# Patient Record
Sex: Female | Born: 1965 | Hispanic: No | Smoking: Never smoker
Health system: Southern US, Community
[De-identification: ages and names within clinical notes are randomized; demographics above are authoritative.]

## PROBLEM LIST (undated history)

## (undated) DIAGNOSIS — M26609 Unspecified temporomandibular joint disorder, unspecified side: Secondary | ICD-10-CM

## (undated) DIAGNOSIS — J45909 Unspecified asthma, uncomplicated: Secondary | ICD-10-CM

## (undated) DIAGNOSIS — F410 Panic disorder [episodic paroxysmal anxiety] without agoraphobia: Secondary | ICD-10-CM

## (undated) HISTORY — PX: GASTRIC BYPASS: SHX52

---

## 2014-09-10 ENCOUNTER — Encounter (HOSPITAL_COMMUNITY): Payer: Self-pay

## 2014-09-10 ENCOUNTER — Emergency Department (HOSPITAL_COMMUNITY)
Admission: EM | Admit: 2014-09-10 | Discharge: 2014-09-10 | Disposition: A | Discharge status: Home / Self Care | Payer: Self-pay | Source: Home / Self Care | Attending: Emergency Medicine | Admitting: Emergency Medicine

## 2014-09-10 DIAGNOSIS — M2669 Other specified disorders of temporomandibular joint: Secondary | ICD-10-CM

## 2014-09-10 DIAGNOSIS — F508 Other eating disorders: Secondary | ICD-10-CM

## 2014-09-10 DIAGNOSIS — M26629 Arthralgia of temporomandibular joint, unspecified side: Secondary | ICD-10-CM

## 2014-09-10 DIAGNOSIS — N390 Urinary tract infection, site not specified: Secondary | ICD-10-CM

## 2014-09-10 DIAGNOSIS — F5089 Other specified eating disorder: Secondary | ICD-10-CM

## 2014-09-10 HISTORY — DX: Unspecified asthma, uncomplicated: J45.909

## 2014-09-10 HISTORY — DX: Unspecified temporomandibular joint disorder, unspecified side: M26.609

## 2014-09-10 LAB — POCT URINALYSIS DIP (DEVICE)
BILIRUBIN URINE: NEGATIVE
Glucose, UA: NEGATIVE mg/dL
HGB URINE DIPSTICK: NEGATIVE
Ketones, ur: NEGATIVE mg/dL
NITRITE: NEGATIVE
Protein, ur: NEGATIVE mg/dL
UROBILINOGEN UA: 0.2 mg/dL (ref 0.0–1.0)
pH: 5.5 (ref 5.0–8.0)

## 2014-09-10 LAB — POCT PREGNANCY, URINE: Preg Test, Ur: NEGATIVE

## 2014-09-10 MED ORDER — KEFLEX 500 MG PO CAPS: 500.0000 mg | ORAL_CAPSULE | Freq: Four times a day (QID) | ORAL | Status: AC

## 2014-09-10 MED ORDER — FERROUS GLUCONATE 324 (38 FE) MG PO TABS: 324.0000 mg | ORAL_TABLET | Freq: Every day | ORAL | Status: AC

## 2014-09-10 MED ORDER — IBUPROFEN 800 MG PO TABS: 800.0000 mg | ORAL_TABLET | Freq: Three times a day (TID) | ORAL | Status: AC | PRN

## 2014-09-10 NOTE — ED Provider Notes (Signed)
CSN: [id number]     Arrival date & time 09/10/14  1144 History   First MD Initiated Contact with Patient 09/10/14 1155     Chief Complaint  Patient presents with  . Jaw Pain   (Consider location/radiation/quality/duration/timing/severity/associated sxs/prior Treatment) HPI She is a 49 year old woman here for several concerns.  She has TMJ syndrome and needs a refill of her ibuprofen 800 mg tablets.  She also has a diagnosis of pica. She will consume up to 3 boxes of cornstarch a day. This is associated with some chronic anemia per her report. She is on B-12 injections currently. She is not taking any iron right now.  She also reports some lower abdominal cramping. Her last period was in October.  This is associated with some mild nausea. This is been going on for about 2 weeks. No flank pain.  Past Medical History  Diagnosis Date  . Asthma   . TMJ (temporomandibular joint syndrome)    Past Surgical History  Procedure Laterality Date  . Gastric bypass     History reviewed. No pertinent family history. History  Substance Use Topics  . Smoking status: Never Smoker   . Smokeless tobacco: Not on file  . Alcohol Use: No   OB History    No data available     Review of Systems  Constitutional: Negative.   HENT:       Jaw pain, chronic  Respiratory: Negative.   Cardiovascular: Negative.   Gastrointestinal: Positive for nausea and abdominal pain (suprapubic). Negative for vomiting.  Genitourinary: Positive for dysuria.    Allergies  Morphine and related  Home Medications   Prior to Admission medications   Medication Sig Start Date End Date Taking? Authorizing Provider  ferrous gluconate (FERGON) 324 MG tablet Take 1 tablet (324 mg total) by mouth daily with breakfast. 09/10/14   Charm Rings, MD  ibuprofen (ADVIL,MOTRIN) 800 MG tablet Take 1 tablet (800 mg total) by mouth every 8 (eight) hours as needed. 09/10/14   Charm Rings, MD  KEFLEX 500 MG capsule Take 1 capsule (500  mg total) by mouth 4 (four) times daily. 09/10/14   Charm Rings, MD   BP 159/96 mmHg  Pulse 77  Temp(Src) 98.1 F (36.7 C) (Oral)  Resp 16  SpO2 100%  LMP 06/28/2014 (Exact Date) Physical Exam  Constitutional: She is oriented to person, place, and time. She appears well-developed and well-nourished. No distress.  HENT:  Head: Normocephalic and atraumatic.  Mouth/Throat: Abnormal dentition.  Neck: Neck supple.  Cardiovascular: Normal rate.   Pulmonary/Chest: Effort normal.  Abdominal: Soft. Bowel sounds are normal. She exhibits no distension. There is tenderness (mild suprapubic). There is no rebound and no guarding.  Neurological: She is alert and oriented to person, place, and time.    ED Course  Procedures (including critical care time) Labs Review Labs Reviewed  POCT URINALYSIS DIP (DEVICE) - Abnormal; Notable for the following:    Leukocytes, UA TRACE (*)    All other components within normal limits  URINE CULTURE  POCT PREGNANCY, URINE    Imaging Review No results found.   MDM   1. UTI (lower urinary tract infection)   2. Pica   3. TMJ syndrome    Urine sent for culture. Keflex 4 times a day for 3 days. I refilled her ibuprofen for her TMJ syndrome. I also prescribed ferrous gluconate to see if that will help with her pica. [facility] provided.    Charm Rings, MD 09/10/14 [phone]

## 2014-09-10 NOTE — Discharge Instructions (Signed)
You may have a bladder infection. Take keflex 4 times a day for 3 days.  The pica is likely at least in part due to your anemia.   Try taking Ferrous Gluconate- this tends to be better tolerated than other iron preparations.  I have refilled the ibuprofen 800 for your TMJ.  Follow up as needed.

## 2014-09-10 NOTE — ED Notes (Signed)
C/o pain in her jaw (" TMJ") and lower abdominal pain , no menses since 10-20

## 2014-09-12 LAB — URINE CULTURE

## 2014-09-12 NOTE — ED Notes (Signed)
Urine culture: >100,000 colonies Klebsiella pneumoniae.  Pt. adequately treated with Keflex. Lindsey Campbell 09/12/2014

## 2015-07-11 ENCOUNTER — Emergency Department (HOSPITAL_COMMUNITY)
Admission: EM | Admit: 2015-07-11 | Discharge: 2015-07-11 | Disposition: A | Discharge status: Home / Self Care | Payer: Self-pay | Attending: Emergency Medicine | Admitting: Emergency Medicine

## 2015-07-11 ENCOUNTER — Emergency Department (HOSPITAL_COMMUNITY): Payer: Self-pay

## 2015-07-11 ENCOUNTER — Encounter (HOSPITAL_COMMUNITY): Payer: Self-pay | Admitting: Cardiology

## 2015-07-11 DIAGNOSIS — Z8739 Personal history of other diseases of the musculoskeletal system and connective tissue: Secondary | ICD-10-CM | POA: Insufficient documentation

## 2015-07-11 DIAGNOSIS — R5383 Other fatigue: Secondary | ICD-10-CM | POA: Insufficient documentation

## 2015-07-11 DIAGNOSIS — Z79899 Other long term (current) drug therapy: Secondary | ICD-10-CM | POA: Insufficient documentation

## 2015-07-11 DIAGNOSIS — J45901 Unspecified asthma with (acute) exacerbation: Secondary | ICD-10-CM | POA: Insufficient documentation

## 2015-07-11 DIAGNOSIS — Z8659 Personal history of other mental and behavioral disorders: Secondary | ICD-10-CM | POA: Insufficient documentation

## 2015-07-11 DIAGNOSIS — Z792 Long term (current) use of antibiotics: Secondary | ICD-10-CM | POA: Insufficient documentation

## 2015-07-11 HISTORY — DX: Panic disorder (episodic paroxysmal anxiety): F41.0

## 2015-07-11 MED ORDER — ALBUTEROL SULFATE (2.5 MG/3ML) 0.083% IN NEBU: 2.5000 mg | INHALATION_SOLUTION | Freq: Four times a day (QID) | RESPIRATORY_TRACT | Status: AC | PRN

## 2015-07-11 MED ORDER — ALBUTEROL SULFATE HFA 108 (90 BASE) MCG/ACT IN AERS
2.0000 | INHALATION_SPRAY | RESPIRATORY_TRACT | Status: DC | PRN
  Administered 2015-07-11: 2 via RESPIRATORY_TRACT
  Filled 2015-07-11: qty 6.7

## 2015-07-11 MED ORDER — PREDNISONE 20 MG PO TABS
60.0000 mg | ORAL_TABLET | Freq: Once | ORAL | Status: AC
  Administered 2015-07-11: 60 mg via ORAL
  Filled 2015-07-11: qty 3

## 2015-07-11 MED ORDER — PREDNISONE 20 MG PO TABS
40.0000 mg | ORAL_TABLET | Freq: Every day | ORAL | Status: DC
Start: 2015-07-11 — End: 2015-08-02

## 2015-07-11 MED ORDER — IPRATROPIUM-ALBUTEROL 0.5-2.5 (3) MG/3ML IN SOLN
3.0000 mL | Freq: Once | RESPIRATORY_TRACT | Status: AC
  Administered 2015-07-11: 3 mL via RESPIRATORY_TRACT
  Filled 2015-07-11: qty 3

## 2015-07-11 NOTE — ED Notes (Signed)
Reports for the past month she has had a cough, congestion but no fever. States she has tried OTC medication and tramadol but no helping.

## 2015-07-11 NOTE — ED Notes (Addendum)
Pt c/o cough, wheezing, anterior chest pain and weakness x 2-3 weeks. States she has been using her nebulizer at home without improvement. States "I feel like I'm choking when I lay down at night". DENIES nasal congestion.

## 2015-07-11 NOTE — Discharge Instructions (Signed)
Asthma, Adult Asthma is a condition of the lungs in which the airways tighten and narrow. Asthma can make it hard to breathe. Asthma cannot be cured, but medicine and lifestyle changes can help control it. Asthma may be started (triggered) by:  Animal skin flakes (dander).  Dust.  Cockroaches.  Pollen.  Mold.  Smoke.  Cleaning products.  Hair sprays or aerosol sprays.  Paint fumes or strong smells.  Cold air, weather changes, and winds.  Crying or laughing hard.  Stress.  Certain medicines or drugs.  Foods, such as dried fruit, potato chips, and sparkling grape juice.  Infections or conditions (colds, flu).  Exercise.  Certain medical conditions or diseases.  Exercise or tiring activities. HOME CARE   Take medicine as told by your doctor.  Use a peak flow meter as told by your doctor. A peak flow meter is a tool that measures how well the lungs are working.  Record and keep track of the peak flow meter's readings.  Understand and use the asthma action plan. An asthma action plan is a written plan for taking care of your asthma and treating your attacks.  To help prevent asthma attacks:  Do not smoke. Stay away from secondhand smoke.  Change your heating and air conditioning filter often.  Limit your use of fireplaces and wood stoves.  Get rid of pests (such as roaches and mice) and their droppings.  Throw away plants if you see mold on them.  Clean your floors. Dust regularly. Use cleaning products that do not smell.  Have someone vacuum when you are not home. Use a vacuum cleaner with a HEPA filter if possible.  Replace carpet with wood, tile, or vinyl flooring. Carpet can trap animal skin flakes and dust.  Use allergy-proof pillows, mattress covers, and box spring covers.  Wash bed sheets and blankets every week in hot water and dry them in a dryer.  Use blankets that are made of polyester or cotton.  Clean bathrooms and kitchens with bleach.  If possible, have someone repaint the walls in these rooms with mold-resistant paint. Keep out of the rooms that are being cleaned and painted.  Wash hands often. GET HELP IF:  You have make a whistling sound when breaking (wheeze), have shortness of breath, or have a cough even if taking medicine to prevent attacks.  The colored mucus you cough up (sputum) is thicker than usual.  The colored mucus you cough up changes from clear or white to yellow, green, gray, or bloody.  You have problems from the medicine you are taking such as:  A rash.  Itching.  Swelling.  Trouble breathing.  You need reliever medicines more than 2-3 times a week.  Your peak flow measurement is still at 50-79% of your personal best after following the action plan for 1 hour.  You have a fever. GET HELP RIGHT AWAY IF:   You seem to be worse and are not responding to medicine during an asthma attack.  You are short of breath even at rest.  You get short of breath when doing very little activity.  You have trouble eating, drinking, or talking.  You have chest pain.  You have a fast heartbeat.  Your lips or fingernails start to turn blue.  You are light-headed, dizzy, or faint.  Your peak flow is less than 50% of your personal best.   This information is not intended to replace advice given to you by your health care provider. Make sure   you discuss any questions you have with your health care provider.   Document Released: 02/12/2008 Document Revised: 05/17/2015 Document Reviewed: 03/25/2013 Elsevier Interactive Patient Education 2016 [employer]  

## 2015-07-11 NOTE — ED Provider Notes (Signed)
CSN: [id number]     Arrival date & time 07/11/15  1028 History  By signing my name below, I, Lindsey Campbell, attest that this documentation has been prepared under the direction and in the presence of non-physician practitioner, Teressa LowerVrinda Britian Jentz, NP. Electronically Signed: Freida Busmaniana Campbell, Scribe. 07/11/2015. 11:16 AM.  Chief Complaint  Patient presents with  . Cough  . Nasal Congestion   The history is provided by the patient. No language interpreter was used.   HPI Comments:  Lindsey Campbell is a 49 y.o. female with a history of asthma, who presents to the Emergency Department complaining of persistent cough for 3 weeks. She reports associated congestion;  describes "rattling" in her central chest, fatigue and chest tightness. She has been using old albuterol neb treatment with temp relief. Her last treatment was ~0700. Pt denies recent use of steroids. Pt denies fever and chills.   Past Medical History  Diagnosis Date  . Asthma   . TMJ (temporomandibular joint syndrome)   . Panic attack    Past Surgical History  Procedure Laterality Date  . Gastric bypass     History reviewed. No pertinent family history. Social History  Substance Use Topics  . Smoking status: Never Smoker   . Smokeless tobacco: None  . Alcohol Use: No   OB History    No data available     Review of Systems  Constitutional: Positive for fatigue. Negative for fever and chills.  HENT: Positive for congestion.   Respiratory: Positive for cough and chest tightness. Negative for shortness of breath.   Cardiovascular: Negative for chest pain.   Allergies  Morphine and related  Home Medications   Prior to Admission medications   Medication Sig Start Date End Date Taking? Authorizing Provider  ferrous gluconate (FERGON) 324 MG tablet Take 1 tablet (324 mg total) by mouth daily with breakfast. 09/10/14   Charm RingsErin J Honig, MD  ibuprofen (ADVIL,MOTRIN) 800 MG tablet Take 1 tablet (800 mg total) by mouth every 8  (eight) hours as needed. 09/10/14   Charm RingsErin J Honig, MD  KEFLEX 500 MG capsule Take 1 capsule (500 mg total) by mouth 4 (four) times daily. 09/10/14   Charm RingsErin J Honig, MD   BP 148/111 mmHg  Pulse 67  Temp(Src) 98.2 F (36.8 C) (Oral)  Resp 18  SpO2 99%  LMP 06/26/2015 Physical Exam  Constitutional: She is oriented to person, place, and time. She appears well-developed and well-nourished. No distress.  HENT:  Head: Normocephalic and atraumatic.  Right Ear: External ear normal.  Left Ear: External ear normal.  Mouth/Throat: Oropharynx is clear and moist.  Eyes: Conjunctivae are normal.  Neck: Normal range of motion. Neck supple.  Cardiovascular: Normal rate.   Pulmonary/Chest: Effort normal. No respiratory distress. She has wheezes.  Abdominal: Soft. Bowel sounds are normal. She exhibits no distension. There is no tenderness.  Musculoskeletal: Normal range of motion.  Neurological: She is alert and oriented to person, place, and time. She exhibits normal muscle tone. Coordination normal.  Skin: Skin is warm and dry.  Psychiatric: She has a normal mood and affect.  Nursing note and vitals reviewed.   ED Course  Procedures   DIAGNOSTIC STUDIES:  Oxygen Saturation is 99% on RA, normal by my interpretation.    COORDINATION OF CARE:  11:16 AM Discussed treatment plan with pt at bedside and pt agreed to plan.  Labs Review Labs Reviewed - No data to display  Imaging Review Dg Chest 2 View  07/11/2015  CLINICAL  DATA:  Wheezing, history of asthma EXAM: CHEST  2 VIEW COMPARISON:  None. FINDINGS: Cardiomediastinal silhouette is unremarkable. No acute infiltrate or pleural effusion. No pulmonary edema. Bony thorax is unremarkable. IMPRESSION: No active cardiopulmonary disease. Electronically Signed   By: Natasha Mead M.D.   On: 07/11/2015 12:35   I have personally reviewed and evaluated these images as part of my medical decision-making.   EKG Interpretation None      MDM   Final  diagnoses:  Asthma exacerbation    Will send home with inhaler and nebs and prednisone. No pneumonia noted on xray. Discussed return precautions with pt  I personally performed the services described in this documentation, which was scribed in my presence. The recorded information has been reviewed and is accurate.    Teressa Lower, NP 07/11/15 1256  Benjiman Core, MD 07/12/15 [phone]

## 2015-08-02 ENCOUNTER — Encounter (HOSPITAL_COMMUNITY): Payer: Self-pay

## 2015-08-02 ENCOUNTER — Emergency Department (HOSPITAL_COMMUNITY)
Admission: EM | Admit: 2015-08-02 | Discharge: 2015-08-02 | Disposition: A | Discharge status: Home / Self Care | Payer: Self-pay | Attending: Emergency Medicine | Admitting: Emergency Medicine

## 2015-08-02 DIAGNOSIS — Z8739 Personal history of other diseases of the musculoskeletal system and connective tissue: Secondary | ICD-10-CM | POA: Insufficient documentation

## 2015-08-02 DIAGNOSIS — Z8659 Personal history of other mental and behavioral disorders: Secondary | ICD-10-CM | POA: Insufficient documentation

## 2015-08-02 DIAGNOSIS — D508 Other iron deficiency anemias: Secondary | ICD-10-CM | POA: Insufficient documentation

## 2015-08-02 DIAGNOSIS — R21 Rash and other nonspecific skin eruption: Secondary | ICD-10-CM | POA: Insufficient documentation

## 2015-08-02 DIAGNOSIS — J45901 Unspecified asthma with (acute) exacerbation: Secondary | ICD-10-CM | POA: Insufficient documentation

## 2015-08-02 DIAGNOSIS — R197 Diarrhea, unspecified: Secondary | ICD-10-CM | POA: Insufficient documentation

## 2015-08-02 DIAGNOSIS — Z7952 Long term (current) use of systemic steroids: Secondary | ICD-10-CM | POA: Insufficient documentation

## 2015-08-02 DIAGNOSIS — J069 Acute upper respiratory infection, unspecified: Secondary | ICD-10-CM | POA: Insufficient documentation

## 2015-08-02 LAB — I-STAT CHEM 8, ED
BUN: 8 mg/dL (ref 6–20)
CALCIUM ION: 0.98 mmol/L — AB (ref 1.12–1.23)
Chloride: 107 mmol/L (ref 101–111)
Creatinine, Ser: 0.7 mg/dL (ref 0.44–1.00)
GLUCOSE: 82 mg/dL (ref 65–99)
HCT: 22 % — ABNORMAL LOW (ref 36.0–46.0)
Hemoglobin: 7.5 g/dL — ABNORMAL LOW (ref 12.0–15.0)
Potassium: 3.3 mmol/L — ABNORMAL LOW (ref 3.5–5.1)
SODIUM: 130 mmol/L — AB (ref 135–145)
TCO2: 22 mmol/L (ref 0–100)

## 2015-08-02 MED ORDER — PREDNISONE 10 MG PO TABS: 20.0000 mg | ORAL_TABLET | Freq: Every day | ORAL | Status: AC

## 2015-08-02 MED ORDER — AEROCHAMBER PLUS FLO-VU MEDIUM MISC
1.0000 | Freq: Once | Status: AC
  Administered 2015-08-02: 1
  Filled 2015-08-02: qty 1

## 2015-08-02 MED ORDER — ALBUTEROL SULFATE HFA 108 (90 BASE) MCG/ACT IN AERS
2.0000 | INHALATION_SPRAY | RESPIRATORY_TRACT | Status: DC | PRN
  Administered 2015-08-02: 2 via RESPIRATORY_TRACT
  Filled 2015-08-02: qty 6.7

## 2015-08-02 NOTE — Discharge Instructions (Signed)
Please call [facility] to be seen asap.  You are also given a referral to hematology.

## 2015-08-02 NOTE — ED Provider Notes (Signed)
CSN: [id number]     Arrival date & time 08/02/15  0810 History   First MD Initiated Contact with Patient 08/02/15 [phone]     Chief Complaint  Patient presents with  . Asthma  . Chest Pain     (Consider location/radiation/quality/duration/timing/severity/associated sxs/prior Treatment) HPI This is a 49 year old female who comes in today complaining of asthma and bronchitis symptoms. She states that she has had increased wheezing over the past week. During the past 24 hours she has felt she is unable to cough something up from her throat. She states it improves somewhat with using her inhaler but still feels like there is something there. Her cough has been productive of yellow sputum since last night. She has had some subjective fever. She had one episode of loose stool. She denies headache, nasal congestion, sore throat, vomiting.  She endorses some diffuse muscle aches. She has a history of gastric bypass and subsequent anemia. She states that she feels like her blood count may be low. She has not had this checked recently. She has recently moved to this area and has not had primary care. She was seen in the emergency department in early November for some asthma type symptoms. She received an inhaler at that time. She does not use a spacer. She was placed on prednisone and had completed that several weeks ago. She denies any hematemesis, tarry stool, or bright red blood per rectum, weight change. Past Medical History  Diagnosis Date  . Asthma   . TMJ (temporomandibular joint syndrome)   . Panic attack    Past Surgical History  Procedure Laterality Date  . Gastric bypass     History reviewed. No pertinent family history. Social History  Substance Use Topics  . Smoking status: Never Smoker   . Smokeless tobacco: None  . Alcohol Use: No   OB History    No data available     Review of Systems    Allergies  Morphine and related and Shellfish allergy  Home Medications   Prior to  Admission medications   Medication Sig Start Date End Date Taking? Authorizing Provider  albuterol (PROVENTIL) (2.5 MG/3ML) 0.083% nebulizer solution Take 3 mLs (2.5 mg total) by nebulization every 6 (six) hours as needed for wheezing or shortness of breath. 07/11/15  Yes Teressa LowerVrinda Pickering, NP  predniSONE (DELTASONE) 20 MG tablet Take 2 tablets (40 mg total) by mouth daily. 07/11/15  Yes Teressa LowerVrinda Pickering, NP  ferrous gluconate (FERGON) 324 MG tablet Take 1 tablet (324 mg total) by mouth daily with breakfast. Patient not taking: Reported on 08/02/2015 09/10/14   Charm RingsErin J Honig, MD  ibuprofen (ADVIL,MOTRIN) 800 MG tablet Take 1 tablet (800 mg total) by mouth every 8 (eight) hours as needed. Patient not taking: Reported on 08/02/2015 09/10/14   Charm RingsErin J Honig, MD  KEFLEX 500 MG capsule Take 1 capsule (500 mg total) by mouth 4 (four) times daily. Patient not taking: Reported on 08/02/2015 09/10/14   Charm RingsErin J Honig, MD   BP 104/71 mmHg  Pulse 70  Temp(Src) 98.1 F (36.7 C) (Oral)  Resp 16  SpO2 100%  LMP 06/26/2015 Physical Exam  Constitutional: She is oriented to person, place, and time. She appears well-developed and well-nourished.  HENT:  Head: Normocephalic and atraumatic.  Right Ear: External ear normal.  Left Ear: External ear normal.  Nose: Nose normal.  Mouth/Throat: Oropharynx is clear and moist.  Eyes: Conjunctivae and EOM are normal. Pupils are equal, round, and reactive to light.  Neck: Normal range of motion. Neck supple. No JVD present. No tracheal deviation present. No thyromegaly present.  Cardiovascular: Normal rate, regular rhythm, normal heart sounds and intact distal pulses.   Pulmonary/Chest: Effort normal and breath sounds normal. She has no wheezes.  Mild stridor with forced expiration, none with normal expiration  Abdominal: Soft. Bowel sounds are normal. She exhibits no mass. There is no tenderness. There is no guarding.  Musculoskeletal: Normal range of motion.   Lymphadenopathy:    She has no cervical adenopathy.  Neurological: She is alert and oriented to person, place, and time. She has normal reflexes. No cranial nerve deficit or sensory deficit. Gait normal. GCS eye subscore is 4. GCS verbal subscore is 5. GCS motor subscore is 6.  Reflex Scores:      Bicep reflexes are 2+ on the right side and 2+ on the left side.      Patellar reflexes are 2+ on the right side and 2+ on the left side. Strength is normal and equal throughout. Cranial nerves grossly intact. Patient fluent. No gross ataxia and patient able to ambulate without difficulty.  Skin: Skin is warm and dry. Rash noted.  Psychiatric: She has a normal mood and affect. Her behavior is normal. Judgment and thought content normal.  Nursing note and vitals reviewed.   ED Course  Procedures (including critical care time) Labs Review Labs Reviewed  I-STAT CHEM 8, ED - Abnormal; Notable for the following:    Sodium 130 (*)    Potassium 3.3 (*)    Calcium, Ion 0.98 (*)    Hemoglobin 7.5 (*)    HCT 22.0 (*)    All other components within normal limits    Imaging Review No results found. I have personally reviewed and evaluated these images and lab results as part of my medical decision-making.   EKG Interpretation   Date/Time:  Wednesday August 02 2015 08:19:32 EST Ventricular Rate:  71 PR Interval:  201 QRS Duration: 87 QT Interval:  409 QTC Calculation: 444 R Axis:   39 Text Interpretation:  Sinus rhythm Confirmed by Tyren Dugar MD, Jelene Albano ([id number])  on 08/02/2015 8:29:54 AM      MDM   Final diagnoses:  URI (upper respiratory infection)  Other iron deficiency anemias   1-wheezing/uri- no wheezing on exam, patient with recent cxr clear, no fever although some yellow sputum, doubt bacterial,no indication abx.  Patient requests prednisone rx to have if asthma exacerbation or worsening symptoms 2- anemia- patient states rash worsens with anemia and she has missed her iron  infusions since moving.  She has established with [facility] but states difficult to get appointment.  Plan referral to hem and patient to call to be seen asap at pmd    Margarita Grizzle, MD 08/02/15 [phone]

## 2015-08-02 NOTE — ED Notes (Signed)
Patient undressed, in gown, on monitor, continuous pulse oximetry and blood pressure cuff 

## 2015-08-02 NOTE — ED Notes (Addendum)
Pt c/o exacerbation of asthma and bronchitis. Pt uses inhaler at home. Pt c/o pain in chest but states "it's from all my coughing and wheezing." Pt coughed up yellow sputum last night. Pt states she fell down stairs last night as well - pt is unsure if she passed out or if she tripped.  Pt used albuterol inhaler this morning and another tx this morning prior to arrival with minimal relief.

## 2016-11-13 IMAGING — CR DG CHEST 2V
2 series · 2 of 2 positions shown · non-contrast
Comparison: None.

CLINICAL DATA: Wheezing, history of asthma

EXAM:
CHEST  2 VIEW

[chest pa]
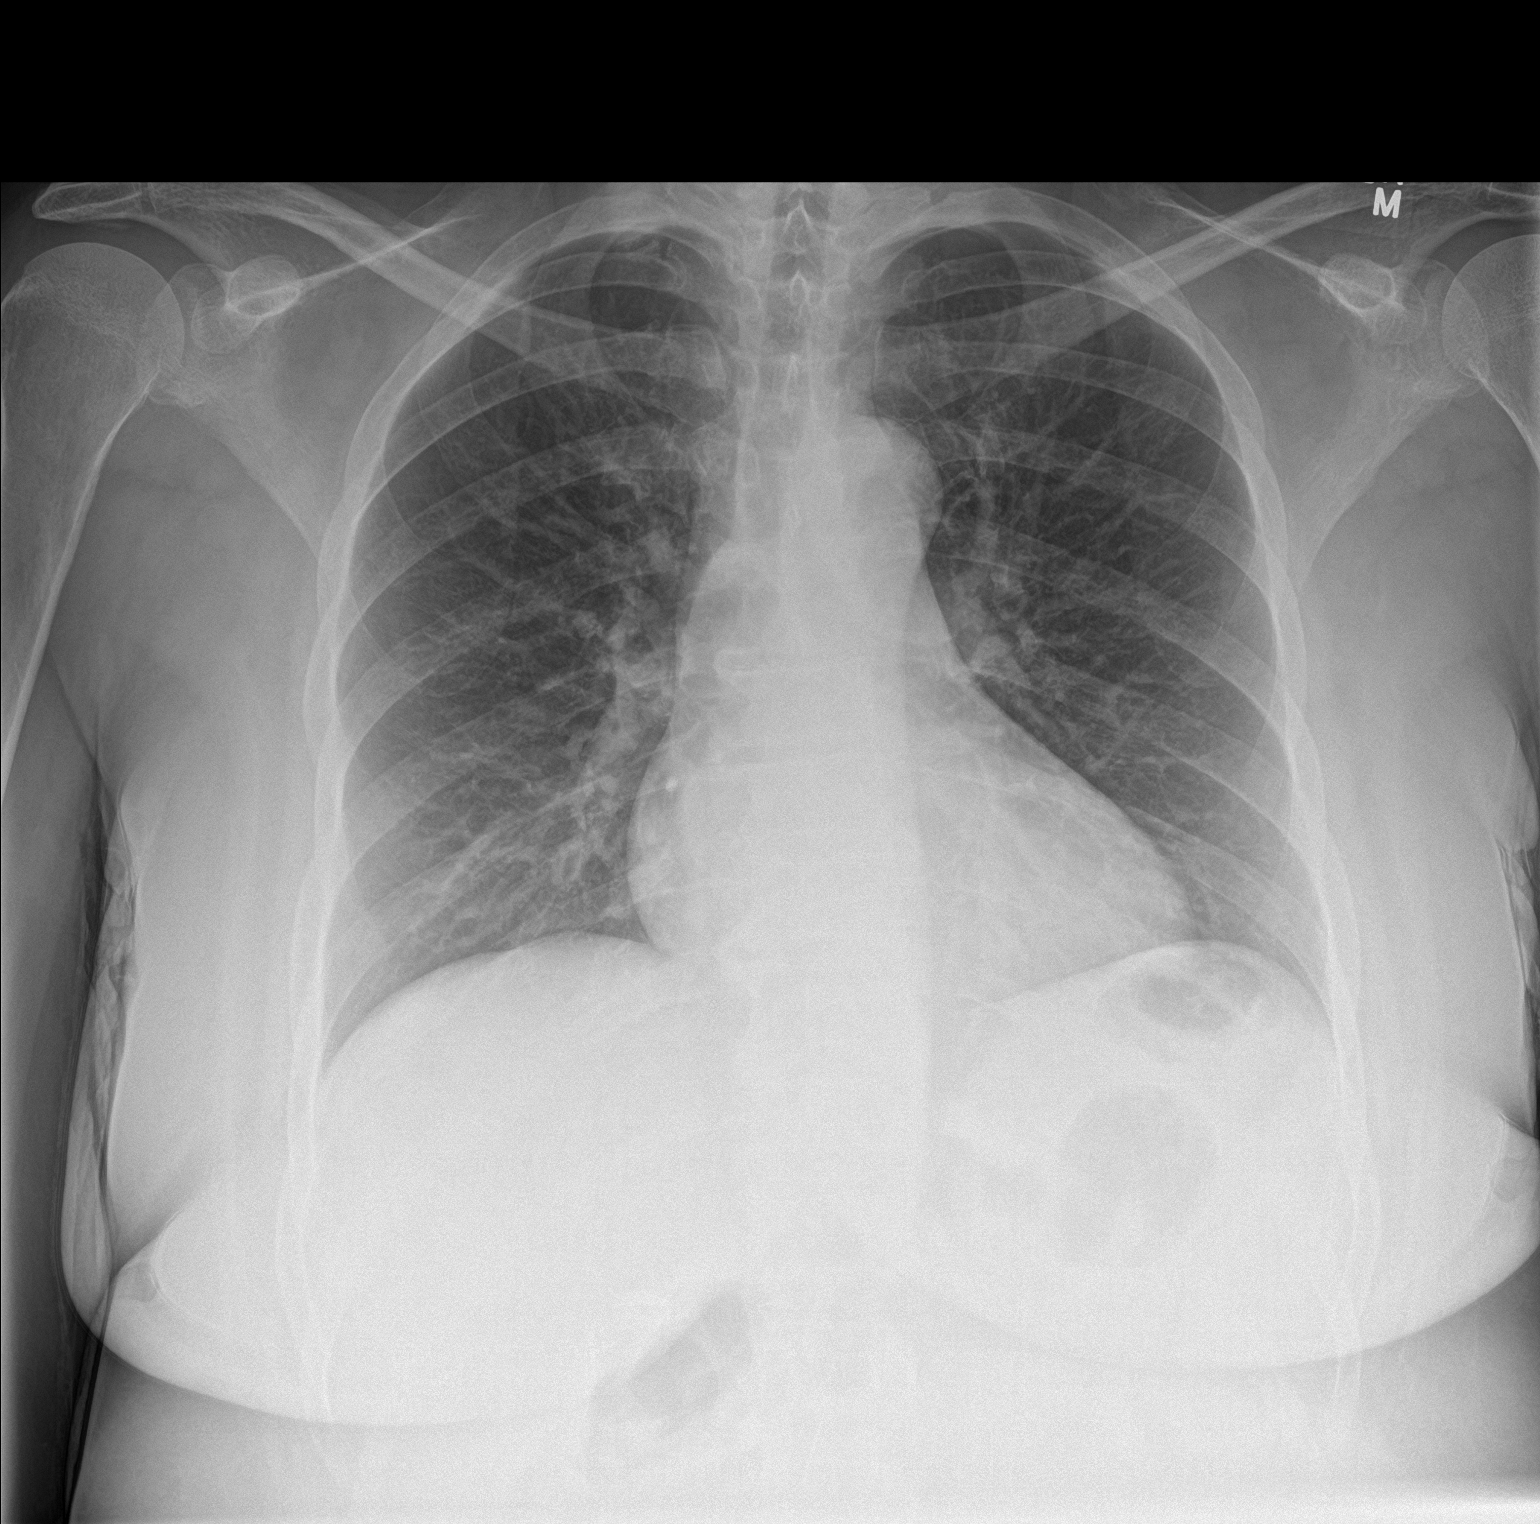

[chest lat]
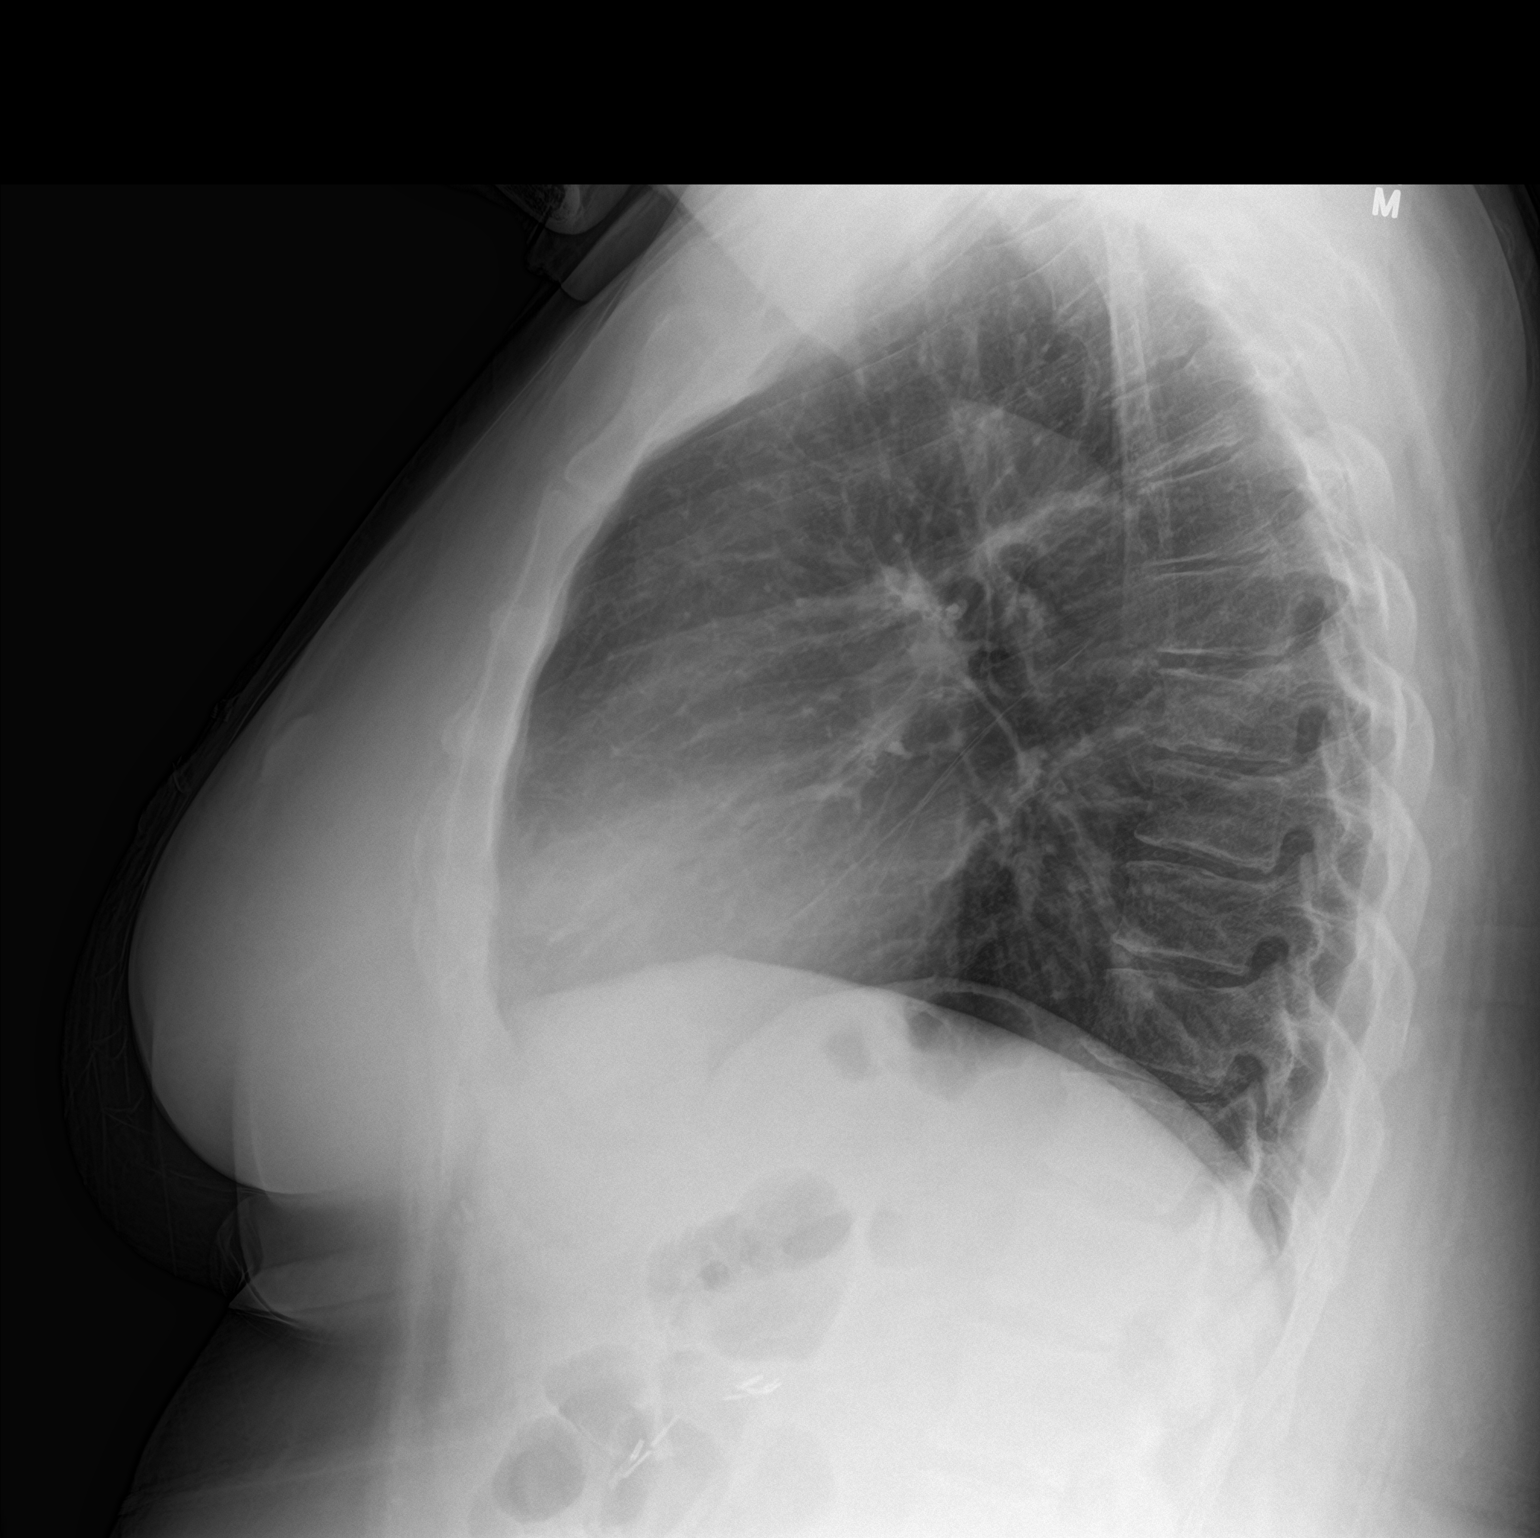

[2 of 2 positions shown; findings below may reference images not displayed]

FINDINGS: Cardiomediastinal silhouette is unremarkable. No acute infiltrate or
pleural effusion. No pulmonary edema. Bony thorax is unremarkable.
IMPRESSION: No active cardiopulmonary disease.
